# Patient Record
Sex: Female | Born: 1960 | Race: White | Hispanic: No | Marital: Married | Smoking: Never smoker
Health system: Southern US, Community
[De-identification: ages and names within clinical notes are randomized; demographics above are authoritative.]

---

## 1998-04-11 ENCOUNTER — Other Ambulatory Visit: Admission: RE | Admit: 1998-04-11 | Discharge: 1998-04-11 | Payer: Self-pay | Admitting: Obstetrics and Gynecology

## 1999-06-01 ENCOUNTER — Other Ambulatory Visit: Admission: RE | Admit: 1999-06-01 | Discharge: 1999-06-01 | Payer: Self-pay | Admitting: Obstetrics and Gynecology

## 1999-06-07 ENCOUNTER — Encounter: Payer: Self-pay | Admitting: Obstetrics and Gynecology

## 1999-06-07 ENCOUNTER — Encounter: Admission: RE | Admit: 1999-06-07 | Discharge: 1999-06-07 | Payer: Self-pay | Admitting: Obstetrics and Gynecology

## 2000-02-17 ENCOUNTER — Inpatient Hospital Stay (HOSPITAL_COMMUNITY): Admission: AD | Admit: 2000-02-17 | Discharge: 2000-02-17 | Payer: Self-pay | Admitting: Obstetrics and Gynecology

## 2000-02-19 ENCOUNTER — Inpatient Hospital Stay (HOSPITAL_COMMUNITY): Admission: AD | Admit: 2000-02-19 | Discharge: 2000-02-21 | Payer: Self-pay | Admitting: Obstetrics and Gynecology

## 2000-04-04 ENCOUNTER — Other Ambulatory Visit: Admission: RE | Admit: 2000-04-04 | Discharge: 2000-04-04 | Payer: Self-pay | Admitting: Obstetrics and Gynecology

## 2001-03-20 ENCOUNTER — Encounter: Payer: Self-pay | Admitting: Obstetrics and Gynecology

## 2001-03-20 ENCOUNTER — Encounter: Admission: RE | Admit: 2001-03-20 | Discharge: 2001-03-20 | Payer: Self-pay | Admitting: Obstetrics and Gynecology

## 2001-05-09 ENCOUNTER — Other Ambulatory Visit: Admission: RE | Admit: 2001-05-09 | Discharge: 2001-05-09 | Payer: Self-pay | Admitting: Obstetrics and Gynecology

## 2002-08-17 ENCOUNTER — Other Ambulatory Visit: Admission: RE | Admit: 2002-08-17 | Discharge: 2002-08-17 | Payer: Self-pay | Admitting: Obstetrics and Gynecology

## 2003-11-08 ENCOUNTER — Other Ambulatory Visit: Admission: RE | Admit: 2003-11-08 | Discharge: 2003-11-08 | Payer: Self-pay | Admitting: Obstetrics and Gynecology

## 2004-11-07 ENCOUNTER — Other Ambulatory Visit: Admission: RE | Admit: 2004-11-07 | Discharge: 2004-11-07 | Payer: Self-pay | Admitting: Obstetrics and Gynecology

## 2007-06-17 ENCOUNTER — Encounter: Admission: RE | Admit: 2007-06-17 | Discharge: 2007-06-17 | Payer: Self-pay | Admitting: Obstetrics and Gynecology

## 2010-02-22 ENCOUNTER — Encounter: Admission: RE | Admit: 2010-02-22 | Discharge: 2010-02-22 | Payer: Self-pay | Admitting: Obstetrics and Gynecology

## 2010-07-16 ENCOUNTER — Encounter: Payer: Self-pay | Admitting: Obstetrics and Gynecology

## 2010-11-10 NOTE — Op Note (Signed)
[facility]  Patient:    Crystal Wade, Crystal Wade                       MRN: [id number] Proc. Date: 02/19/00 Adm. Date:  [id number] Attending:  Cordelia Pen Ii                           Operative Report  PREOPERATIVE DIAGNOSIS:       Intrauterine pregnancy at term.  POSTOPERATIVE DIAGNOSIS:      Intrauterine pregnancy at term.  OPERATION:                    Vacuum assisted vaginal delivery.  SURGEON:                      Duke Salvia. Marcelle Overlie, M.D.  ASSISTANT:  ANESTHESIA:                   Epidural anesthesia.  ESTIMATED BLOOD LOSS:         350 cc.  DESCRIPTION OF PROCEDURE:     The patient had a rapid first stage labor. Epidural had been placed but she had an extreme amount of pain and was not able to push very well. The vertex was presenting visible without spreading the labia.  Decision was made to proceed with VE assisted delivery. Tender Touch VE applied.  One traction effort to effect delivery of a female.  There was a loose nuchal cord x 1. There were also two true knots in the cord side by side.  Apgars 9 and 9. The infant was suctioned. The cord was clamped.  The baby was placed in the warmer and doing well.  Placenta delivered spontaneously intact.  Estimated blood loss was 350.  The cervix and upper vagina explored and noted to be intact. There was a second degree laceration repaired in standard fashion with 3-0 Vicryl ________ sutures.  Mother and baby doing well at that point. DD:  02/19/00 TD:  02/20/00 Job: [id number]

## 2016-07-12 ENCOUNTER — Ambulatory Visit (INDEPENDENT_AMBULATORY_CARE_PROVIDER_SITE_OTHER): Payer: Self-pay | Admitting: Orthopedic Surgery

## 2016-07-14 ENCOUNTER — Ambulatory Visit (INDEPENDENT_AMBULATORY_CARE_PROVIDER_SITE_OTHER): Payer: Managed Care, Other (non HMO) | Admitting: Orthopedic Surgery

## 2016-07-14 ENCOUNTER — Ambulatory Visit (INDEPENDENT_AMBULATORY_CARE_PROVIDER_SITE_OTHER): Payer: Self-pay | Admitting: Orthopedic Surgery

## 2016-07-14 DIAGNOSIS — G8929 Other chronic pain: Secondary | ICD-10-CM

## 2016-07-14 DIAGNOSIS — M25511 Pain in right shoulder: Secondary | ICD-10-CM

## 2016-07-14 DIAGNOSIS — M24111 Other articular cartilage disorders, right shoulder: Secondary | ICD-10-CM | POA: Diagnosis not present

## 2016-07-14 NOTE — Progress Notes (Signed)
   Office Visit Note   Patient: Crystal Wade           Date of Birth: 12/19/1960           MRN: [id number] Visit Date: 07/14/2016              Requested by: No referring provider defined for this encounter. PCP: Meriel PicaHOLLAND,RICHARD M, MD  Chief Complaint  Patient presents with  . Right Shoulder - Pain    WUJ:[id number]:Patient is status post subacromial injection for his right shoulder. She states she really did not notice an improvement. She has pain with activities of daily living pain with trying to reach laterally and trying to lift light weight objects and difficulty trying to play tennis. HPI  Assessment & Plan: Visit Diagnoses:  1. Chronic right shoulder pain   2. Labral tear of shoulder, degenerative, right     Plan: With patient's labral pathology we will plan for an MRI scan to further evaluate the labrum. Follow-up after the MRIs obtained and at that time anticipate proceeding with arthroscopic intervention for labral pathology.  Follow-Up Instructions: Return if symptoms worsen or fail to improve.   Ortho Exam Examination patient is alert oriented no adenopathy well-dressed normal affect normal respiratory effort she has a normal gait. Examination she has full range of motion of both shoulders. She has mild pain with Neer and Hawkins impingement test. The biceps tendon is tender to palpation on the right. The before meals joint is nontender to palpation. She has maximal pain with the empty can sign.  Imaging: No results found.  Orders:  Orders Placed This Encounter  Procedures  . MR SHOULDER RIGHT WO CONTRAST   No orders of the defined types were placed in this encounter.    Procedures: No procedures performed  Clinical Data: No additional findings.  Subjective: Review of Systems  Objective: Vital Signs: There were no vitals taken for this visit.  Specialty Comments:  No specialty comments available.  PMFS History: Patient Active Problem List   Diagnosis Date  Noted  . Chronic right shoulder pain 07/14/2016  . Labral tear of shoulder, degenerative, right 07/14/2016   No past medical history on file.  No family history on file.  No past surgical history on file. Social History   Occupational History  . Not on file.   Social History Main Topics  . Smoking status: Not on file  . Smokeless tobacco: Not on file  . Alcohol use Not on file  . Drug use: Unknown  . Sexual activity: Not on file

## 2016-07-27 ENCOUNTER — Ambulatory Visit
Admission: RE | Admit: 2016-07-27 | Discharge: 2016-07-27 | Disposition: A | Discharge status: Home / Self Care | Payer: Managed Care, Other (non HMO) | Source: Ambulatory Visit | Attending: Orthopedic Surgery | Admitting: Orthopedic Surgery

## 2016-07-27 DIAGNOSIS — M24111 Other articular cartilage disorders, right shoulder: Secondary | ICD-10-CM

## 2016-08-02 ENCOUNTER — Ambulatory Visit (INDEPENDENT_AMBULATORY_CARE_PROVIDER_SITE_OTHER): Payer: Managed Care, Other (non HMO) | Admitting: Orthopedic Surgery

## 2016-08-02 ENCOUNTER — Encounter (INDEPENDENT_AMBULATORY_CARE_PROVIDER_SITE_OTHER): Payer: Self-pay | Admitting: Orthopedic Surgery

## 2016-08-02 DIAGNOSIS — G8929 Other chronic pain: Secondary | ICD-10-CM

## 2016-08-02 DIAGNOSIS — M25511 Pain in right shoulder: Secondary | ICD-10-CM | POA: Diagnosis not present

## 2016-08-02 MED ORDER — METHYLPREDNISOLONE ACETATE 40 MG/ML IJ SUSP
40.0000 mg | INTRAMUSCULAR | Status: AC | PRN
  Administered 2016-08-02: 40 mg via INTRA_ARTICULAR

## 2016-08-02 MED ORDER — LIDOCAINE HCL 1 % IJ SOLN
5.0000 mL | INTRAMUSCULAR | Status: AC | PRN
  Administered 2016-08-02: 5 mL

## 2016-08-02 NOTE — Progress Notes (Signed)
   Office Visit Note   Patient: Crystal Wade           Date of Birth: 04/26/1961           MRN: [id number] Visit Date: 08/02/2016              Requested by: Richarda Overlieichard Holland, MD [address] [place], [place] [id number] PCP: Meriel PicaHOLLAND,RICHARD M, MD  Chief Complaint  Patient presents with  . Right Shoulder - Follow-up    MRI review right shoulder    HPI: Patient is a 56 y.o female who presents in office today for review of MRI of right shoulder. Donalee CitrinStepheney L Peele, RT    Assessment & Plan: Visit Diagnoses:  1. Chronic right shoulder pain     Plan: Patient has had temporary relief from previous injection she underwent subacromial injection today. She is given instructions to go to the club to do internal/external rotation strengthening as well as scapular stabilization she will get a trainer for this. If she does not get sufficient relief with these exercises we will proceed with arthroscopic intervention. Her MRI scan was reviewed.  Follow-Up Instructions: Return if symptoms worsen or fail to improve.   Ortho Exam On examination patient is alert oriented no adenopathy well-dressed normal affect normal respiratory effort she has a normal gait. Examination she has pain with Neer and Hawkins impingement test before meals joint is nontender to palpation she has pain with trying to reach behind herself. MRI scan is reviewed which shows partial tearing of the rotator cuff without full-thickness retraction or full-thickness tear. Biceps tendon is intact she does have some glenohumeral synovitis clinically she has no adhesive capsulitis.  Imaging: No results found.  Orders:  No orders of the defined types were placed in this encounter.  No orders of the defined types were placed in this encounter.    Procedures: Large Joint Inj Date/Time: 08/02/2016 2:51 PM Performed by: Jamarious Febo V Authorized by: Nadara MustardUDA, Pinky Ravan V   Consent Given by:  Patient Site marked: the procedure  site was marked   Timeout: prior to procedure the correct patient, procedure, and site was verified   Indications:  Pain and diagnostic evaluation Location:  Shoulder Site:  R subacromial bursa Prep: patient was prepped and draped in usual sterile fashion   Needle Size:  22 G Needle Length:  1.5 inches Approach:  Posterior Ultrasound Guidance: No   Fluoroscopic Guidance: No   Arthrogram: No   Medications:  5 mL lidocaine 1 %; 40 mg methylPREDNISolone acetate 40 MG/ML Aspiration Attempted: No   Patient tolerance:  Patient tolerated the procedure well with no immediate complications    Clinical Data: No additional findings.  Subjective: Review of Systems  Objective: Vital Signs: There were no vitals taken for this visit.  Specialty Comments:  No specialty comments available.  PMFS History: Patient Active Problem List   Diagnosis Date Noted  . Chronic right shoulder pain 07/14/2016  . Labral tear of shoulder, degenerative, right 07/14/2016   History reviewed. No pertinent past medical history.  History reviewed. No pertinent family history.  History reviewed. No pertinent surgical history. Social History   Occupational History  . Not on file.   Social History Main Topics  . Smoking status: Never Smoker  . Smokeless tobacco: Never Used  . Alcohol use Not on file  . Drug use: Unknown  . Sexual activity: Not on file

## 2017-12-11 ENCOUNTER — Other Ambulatory Visit: Payer: Self-pay | Admitting: Obstetrics and Gynecology

## 2017-12-11 DIAGNOSIS — R928 Other abnormal and inconclusive findings on diagnostic imaging of breast: Secondary | ICD-10-CM

## 2017-12-13 ENCOUNTER — Ambulatory Visit
Admission: RE | Admit: 2017-12-13 | Discharge: 2017-12-13 | Disposition: A | Discharge status: Home / Self Care | Payer: BLUE CROSS/BLUE SHIELD | Source: Ambulatory Visit | Attending: Obstetrics and Gynecology | Admitting: Obstetrics and Gynecology

## 2017-12-13 ENCOUNTER — Other Ambulatory Visit: Payer: Self-pay | Admitting: Obstetrics and Gynecology

## 2017-12-13 ENCOUNTER — Ambulatory Visit
Admission: RE | Admit: 2017-12-13 | Discharge: 2017-12-13 | Disposition: A | Discharge status: Home / Self Care | Payer: Managed Care, Other (non HMO) | Source: Ambulatory Visit | Attending: Obstetrics and Gynecology | Admitting: Obstetrics and Gynecology

## 2017-12-13 DIAGNOSIS — N6310 Unspecified lump in the right breast, unspecified quadrant: Secondary | ICD-10-CM

## 2017-12-13 DIAGNOSIS — R928 Other abnormal and inconclusive findings on diagnostic imaging of breast: Secondary | ICD-10-CM

## 2018-06-30 ENCOUNTER — Other Ambulatory Visit: Payer: Self-pay | Admitting: Obstetrics and Gynecology

## 2018-06-30 ENCOUNTER — Ambulatory Visit
Admission: RE | Admit: 2018-06-30 | Discharge: 2018-06-30 | Disposition: A | Discharge status: Home / Self Care | Payer: BLUE CROSS/BLUE SHIELD | Source: Ambulatory Visit | Attending: Obstetrics and Gynecology | Admitting: Obstetrics and Gynecology

## 2018-06-30 DIAGNOSIS — N6310 Unspecified lump in the right breast, unspecified quadrant: Secondary | ICD-10-CM

## 2019-01-05 ENCOUNTER — Other Ambulatory Visit: Payer: BLUE CROSS/BLUE SHIELD

## 2019-01-14 ENCOUNTER — Other Ambulatory Visit: Payer: Self-pay | Admitting: Obstetrics and Gynecology

## 2019-01-14 DIAGNOSIS — Z20822 Contact with and (suspected) exposure to covid-19: Secondary | ICD-10-CM

## 2019-01-18 LAB — NOVEL CORONAVIRUS, NAA: SARS-CoV-2, NAA: NOT DETECTED

## 2019-02-02 ENCOUNTER — Ambulatory Visit
Admission: RE | Admit: 2019-02-02 | Discharge: 2019-02-02 | Disposition: A | Discharge status: Home / Self Care | Payer: BC Managed Care – PPO | Source: Ambulatory Visit | Attending: Obstetrics and Gynecology | Admitting: Obstetrics and Gynecology

## 2019-02-02 ENCOUNTER — Ambulatory Visit: Payer: BLUE CROSS/BLUE SHIELD

## 2019-02-02 ENCOUNTER — Ambulatory Visit
Admission: RE | Admit: 2019-02-02 | Discharge: 2019-02-02 | Disposition: A | Discharge status: Home / Self Care | Payer: BLUE CROSS/BLUE SHIELD | Source: Ambulatory Visit | Attending: Obstetrics and Gynecology | Admitting: Obstetrics and Gynecology

## 2019-02-02 ENCOUNTER — Other Ambulatory Visit: Payer: Self-pay

## 2019-02-02 ENCOUNTER — Other Ambulatory Visit: Payer: Self-pay | Admitting: Obstetrics and Gynecology

## 2019-02-02 DIAGNOSIS — N6310 Unspecified lump in the right breast, unspecified quadrant: Secondary | ICD-10-CM

## 2020-01-12 ENCOUNTER — Other Ambulatory Visit: Payer: Self-pay | Admitting: Obstetrics and Gynecology

## 2020-01-12 DIAGNOSIS — N6310 Unspecified lump in the right breast, unspecified quadrant: Secondary | ICD-10-CM

## 2020-02-03 ENCOUNTER — Other Ambulatory Visit: Payer: BC Managed Care – PPO

## 2020-03-03 ENCOUNTER — Other Ambulatory Visit: Payer: BC Managed Care – PPO

## 2020-03-09 ENCOUNTER — Other Ambulatory Visit: Payer: Self-pay

## 2020-03-09 ENCOUNTER — Ambulatory Visit
Admission: RE | Admit: 2020-03-09 | Discharge: 2020-03-09 | Disposition: A | Discharge status: Home / Self Care | Payer: BC Managed Care – PPO | Source: Ambulatory Visit | Attending: Obstetrics and Gynecology | Admitting: Obstetrics and Gynecology

## 2020-03-09 DIAGNOSIS — N6310 Unspecified lump in the right breast, unspecified quadrant: Secondary | ICD-10-CM

## 2020-05-30 ENCOUNTER — Ambulatory Visit: Payer: BC Managed Care – PPO | Attending: Internal Medicine

## 2020-05-30 DIAGNOSIS — Z23 Encounter for immunization: Secondary | ICD-10-CM

## 2020-05-30 NOTE — Progress Notes (Signed)
   Covid-19 Vaccination Clinic  Name:  Crystal Wade    MRN: [id number] DOB: [birth year]  05/30/2020  [name] was observed post Covid-19 immunization for 15 minutes without incident. She was provided with Vaccine Information Sheet and instruction to access the V-Safe system.   [name] was instructed to call 911 with any severe reactions post vaccine: Marland Kitchen Difficulty breathing  . Swelling of face and throat  . A fast heartbeat  . A bad rash all over body  . Dizziness and weakness   Immunizations Administered    Name Date Dose VIS Date Route   Pfizer COVID-19 Vaccine 05/30/2020  4:03 PM 0.3 mL 04/13/2020 Intramuscular   Manufacturer: [employer], [employer]   Lot: [id number]   NDC: [address]-4

## 2022-03-09 IMAGING — MG DIGITAL DIAGNOSTIC BILAT W/ TOMO W/ CAD
8 series · 9 of 24 positions shown · non-contrast
Comparison: Previous exam(s).

CLINICAL DATA: Follow-up of a probable benign right breast mass
originally seen in Thursday November, 2017.

EXAM:
DIGITAL DIAGNOSTIC BILATERAL MAMMOGRAM WITH CAD AND TOMO
ULTRASOUND LEFT BREAST

[R MLO synth-2D]
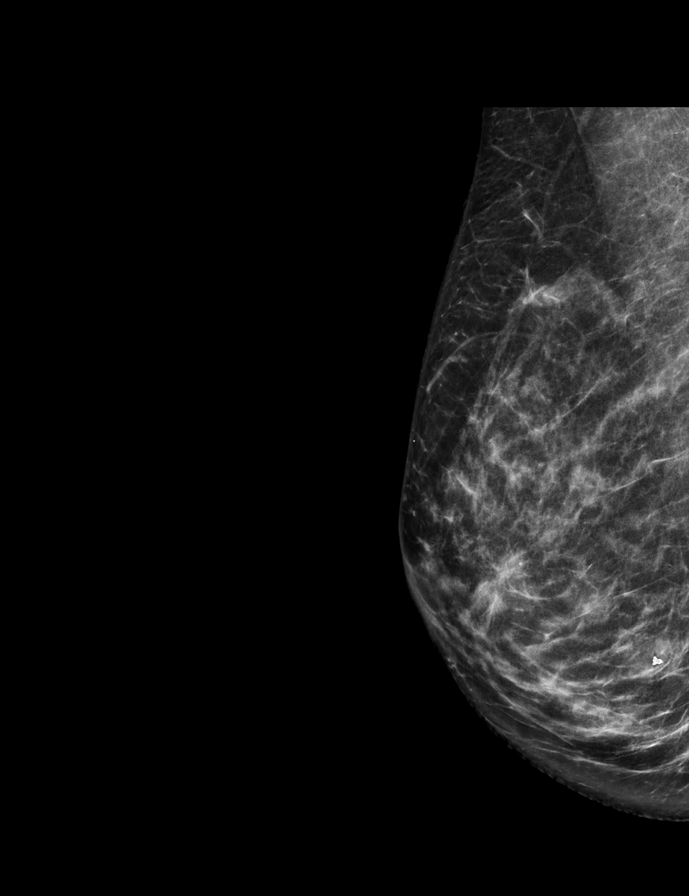

[R CC synth-2D]
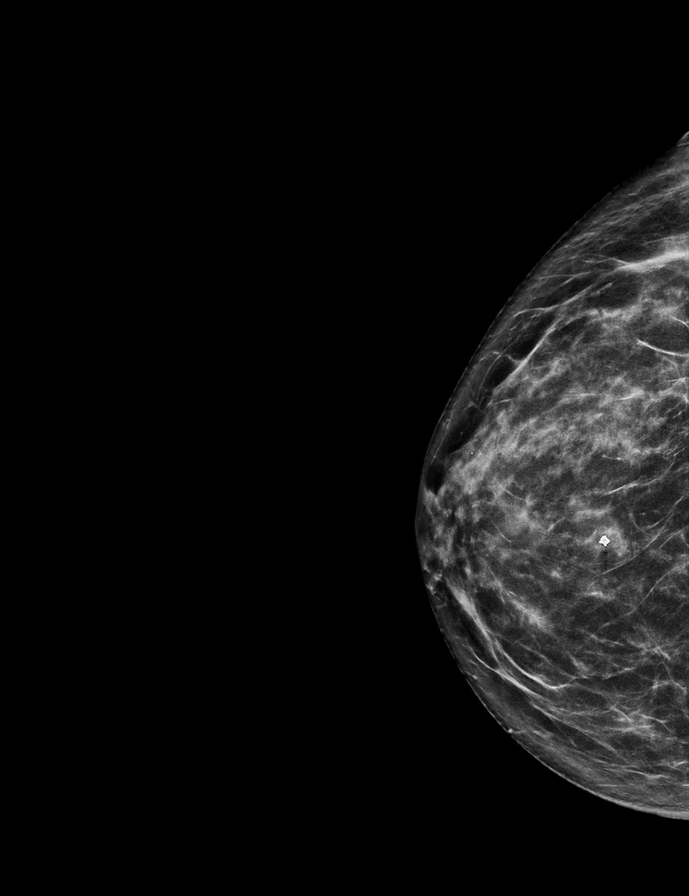

[L CC synth-2D]
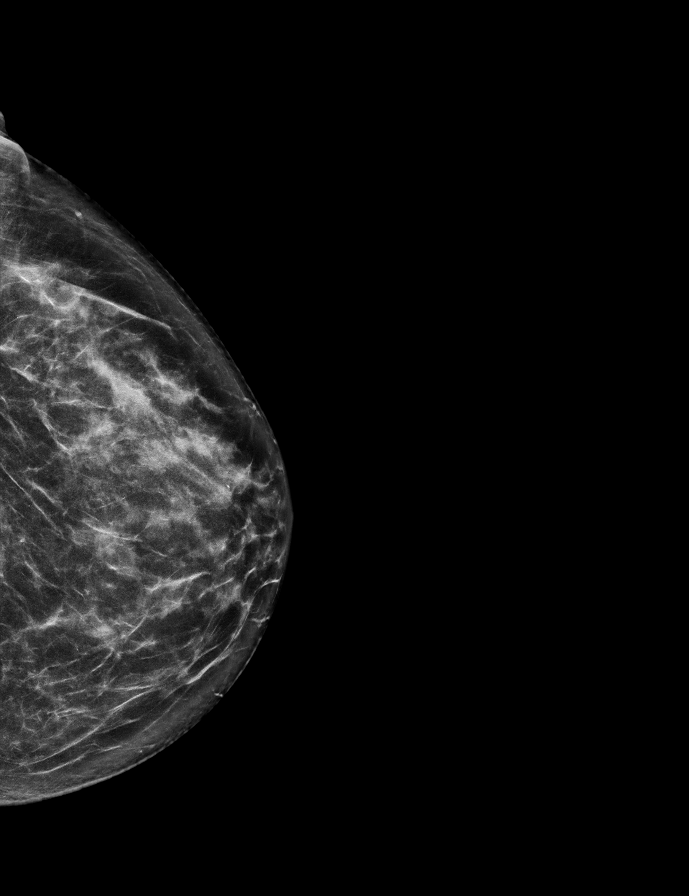

[L MLO synth-2D]
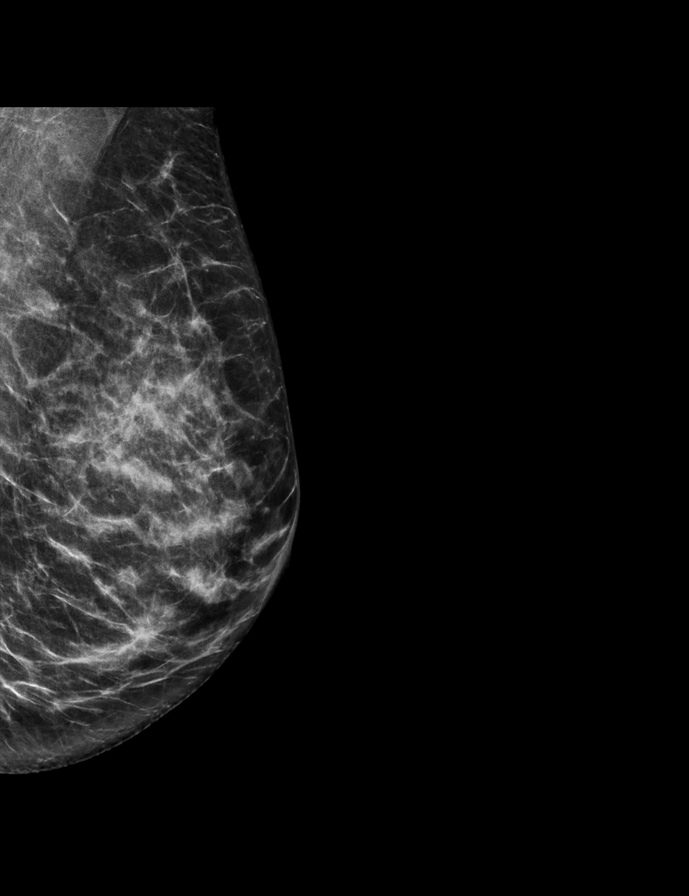

[L MLO tomo · 2 of 60 frames shown]
[frame 20/60]
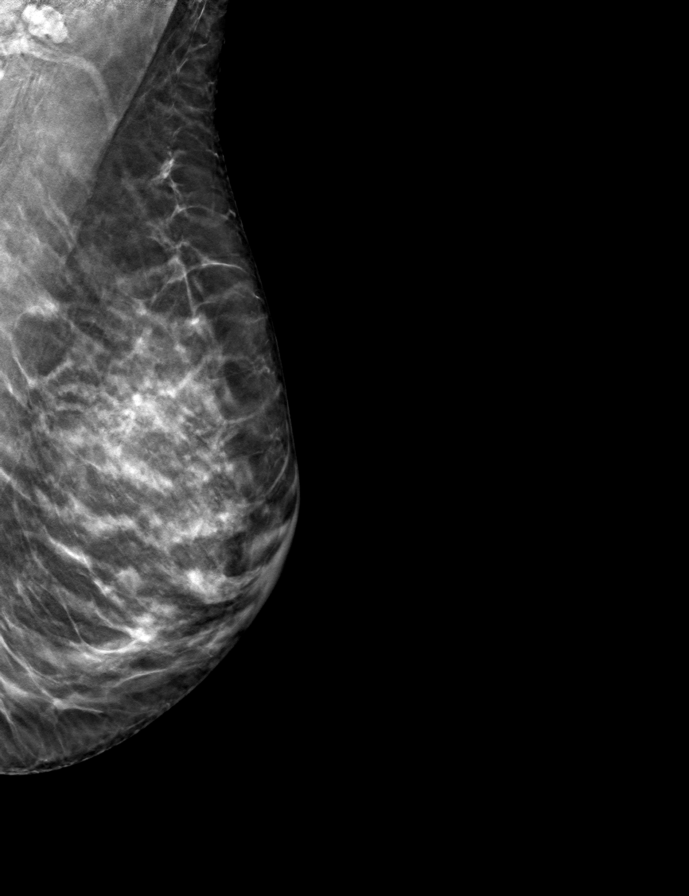
[frame 31/60]
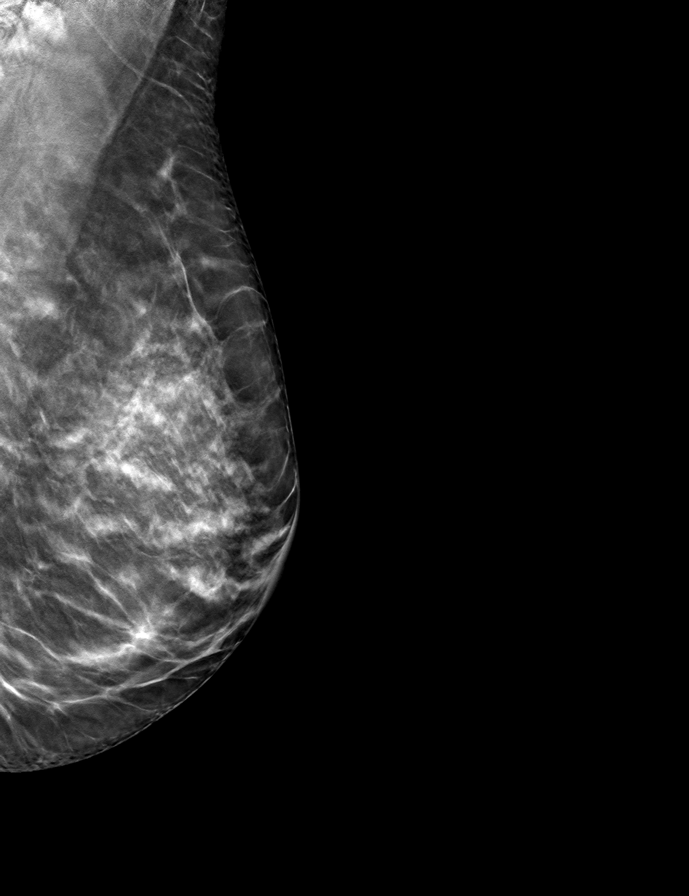

[R MLO tomo · tomo slice 31/62.0]
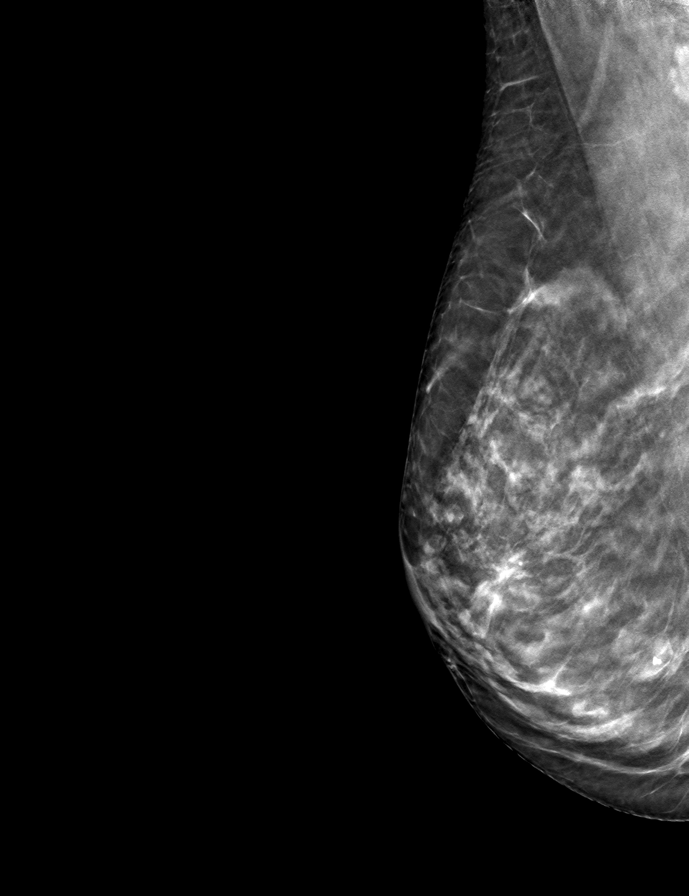

[R CC tomo · tomo slice 33/65.0]
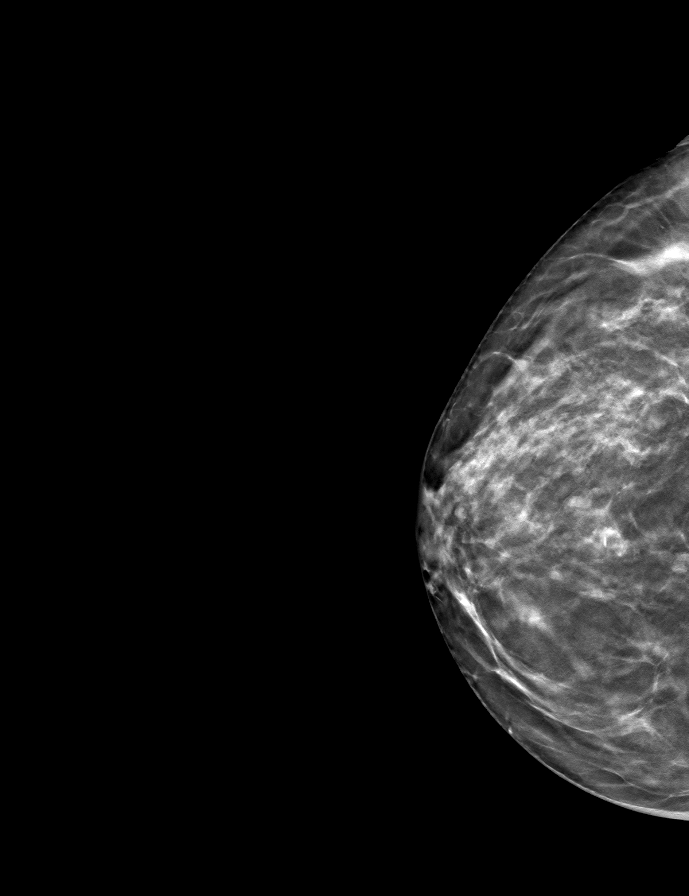

[L CC tomo · tomo slice 34/67.0]
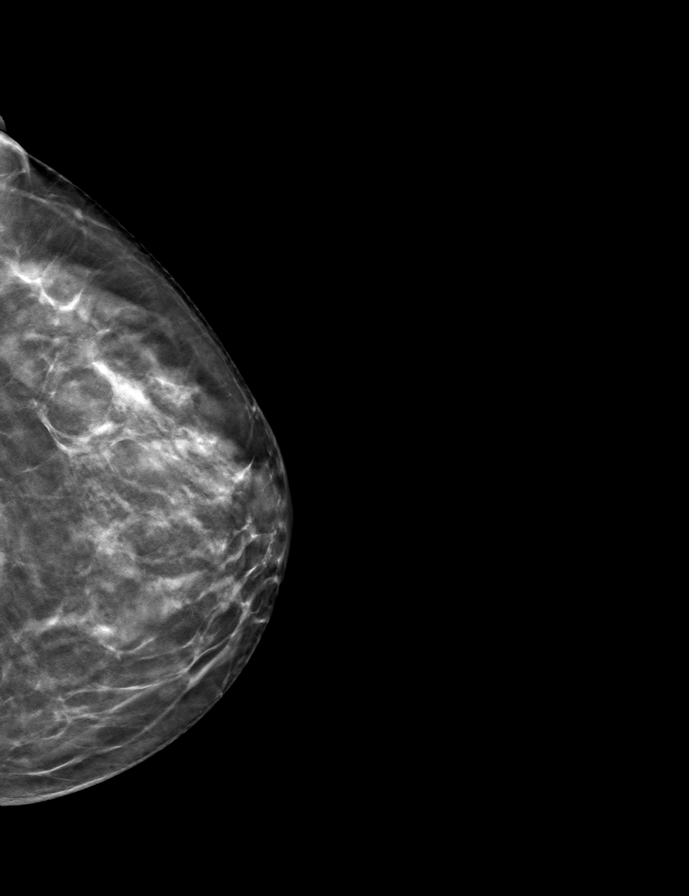

[9 of 24 positions shown; findings below may reference images not displayed]

ACR Breast Density Category c: The breast tissue is heterogeneously
dense, which may obscure small masses.
FINDINGS: No suspicious mass or malignant type microcalcifications identified
in the left breast.

There is a stable obscured mass with a coarse calcification in the
inferior aspect of the right breast. No suspicious mass or malignant
type microcalcifications identified in the right breast.

Mammographic images were processed with CAD.

Targeted ultrasound is performed, showing there is a stable
hypoechoic mass in the right breast at [DATE] 3 cm from the nipple
measuring 1.7 x 0.6 x 1.2 cm. On the prior ultrasound dated
12/13/2017 it measured 1.6 x 0.6 x 1.2 cm.
IMPRESSION: Stable benign-appearing mass in the right breast. No evidence of
malignancy in either breast.

RECOMMENDATION:
Bilateral screening mammogram in 1 year is recommended.

I have discussed the findings and recommendations with the patient.
If applicable, a reminder letter will be sent to the patient
regarding the next appointment.

BI-RADS CATEGORY  2: Benign.

## 2022-03-09 IMAGING — US US BREAST*R* LIMITED INC AXILLA
1 series · 5 of 5 positions shown · non-contrast
Comparison: Previous exam(s).

CLINICAL DATA: Follow-up of a probable benign right breast mass
originally seen in Thursday November, 2017.

EXAM:
DIGITAL DIAGNOSTIC BILATERAL MAMMOGRAM WITH CAD AND TOMO
ULTRASOUND LEFT BREAST

[Series 1: us breast*right* limited inc axilla · 0.06mm/px · 5 of 5 slices shown]
[im 1/5]
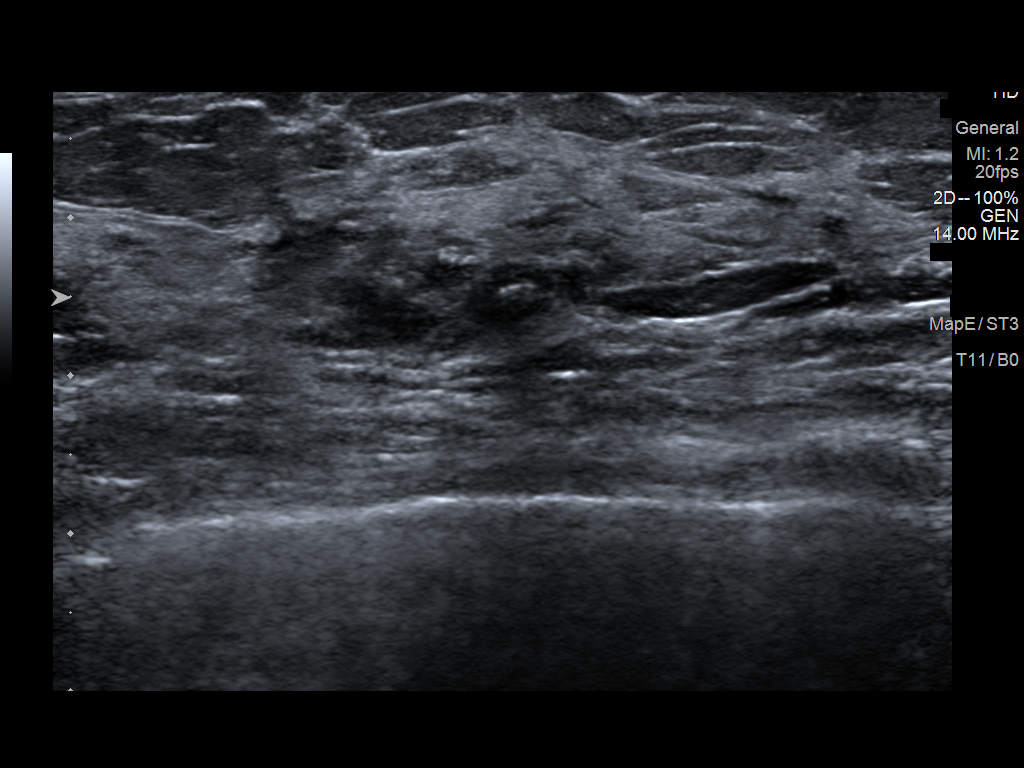
[im 2/5]
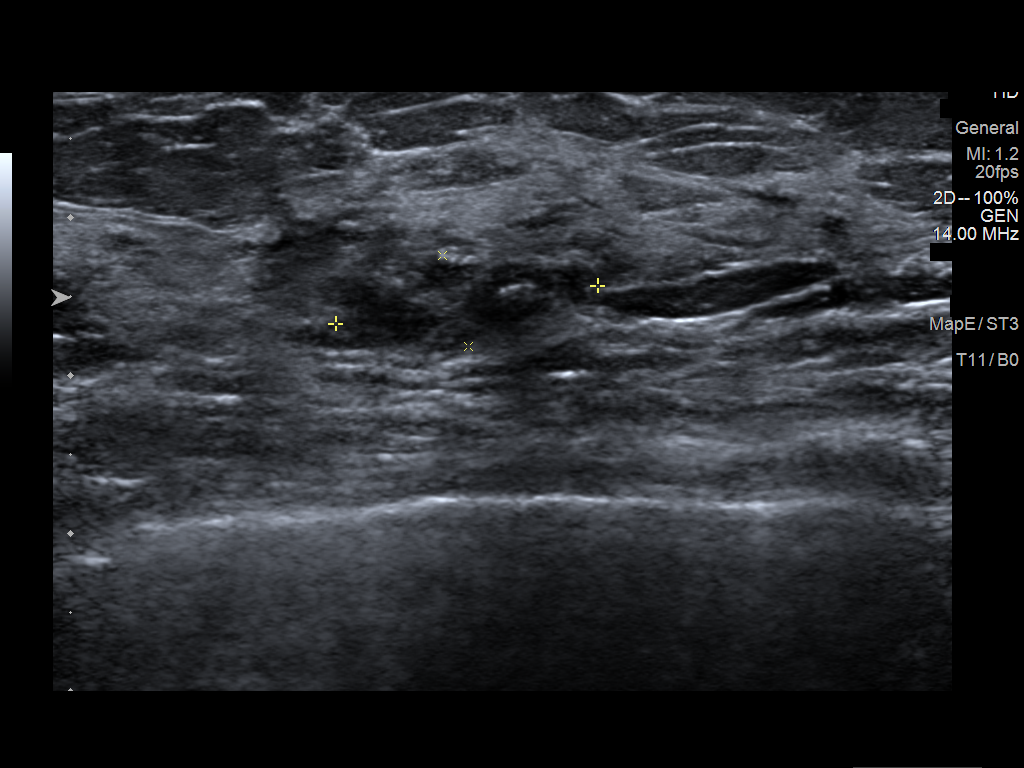
[im 3/5]
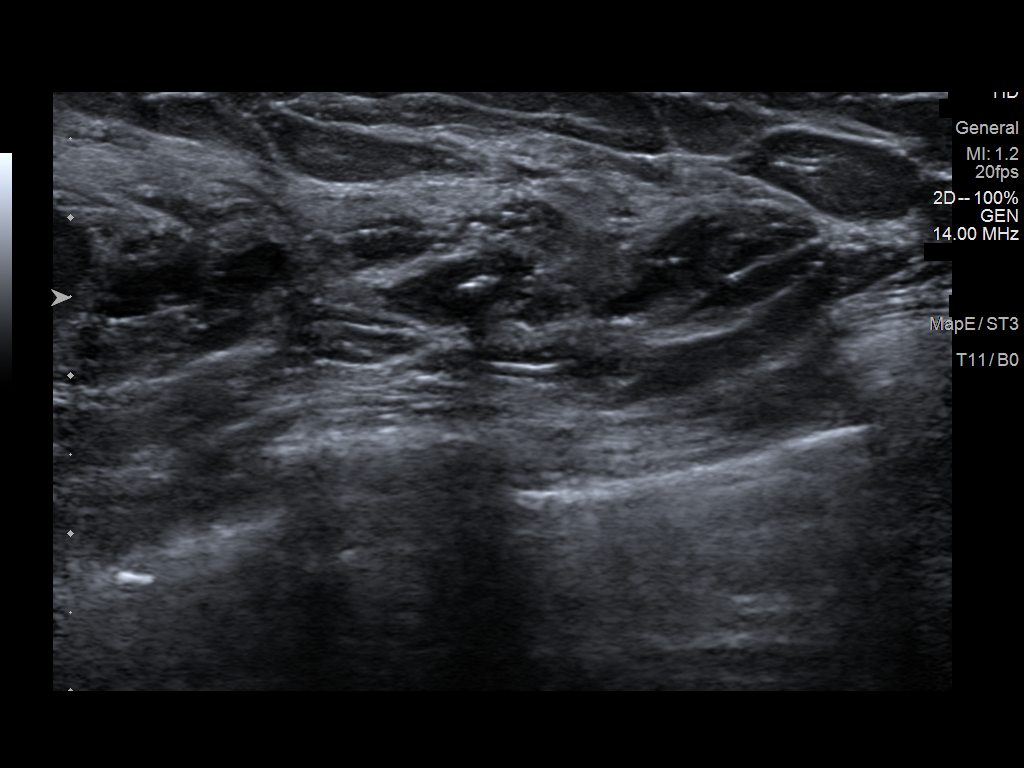
[im 4/5]
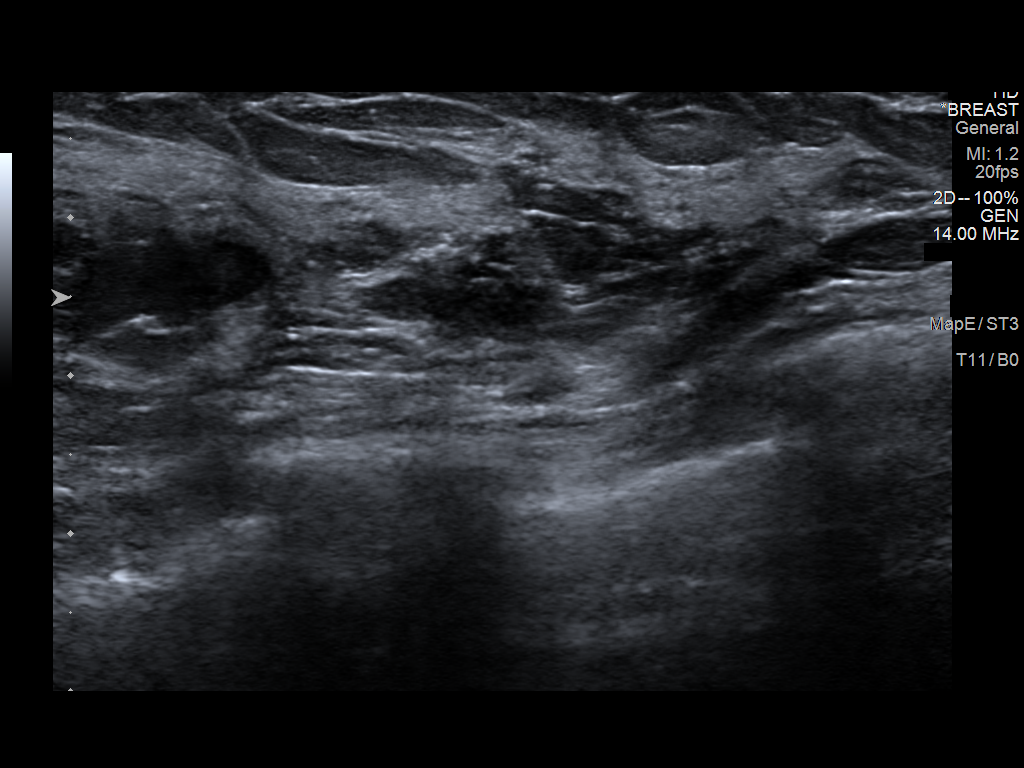
[im 5/5]
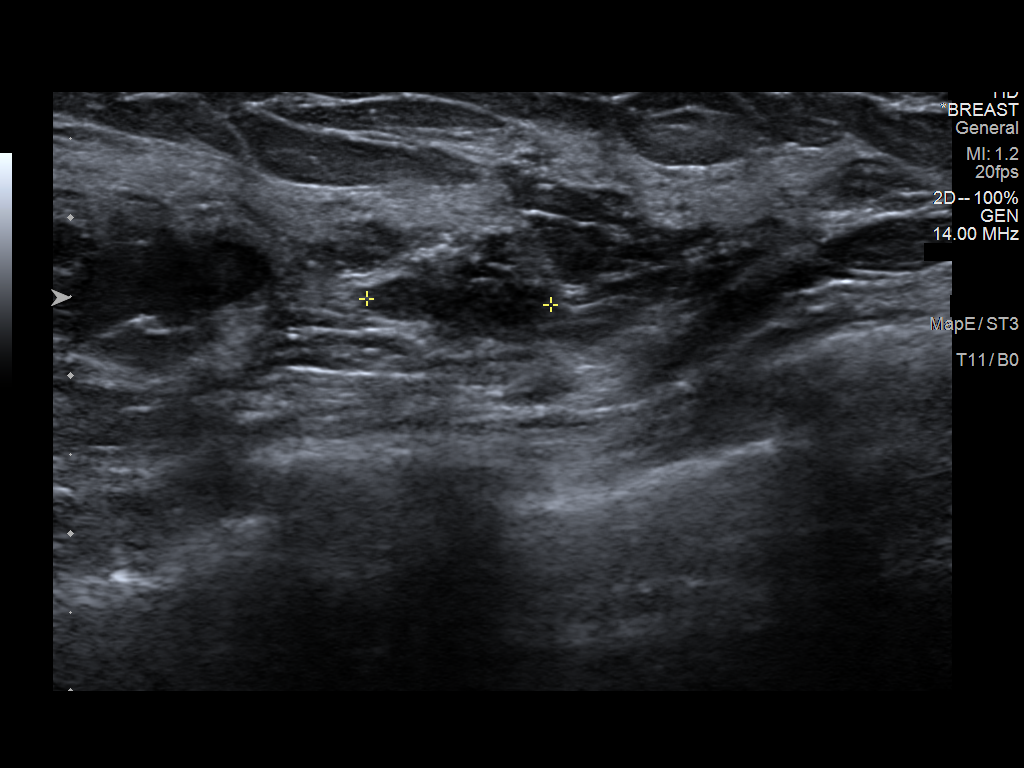

[5 of 5 positions shown; findings below may reference images not displayed]

ACR Breast Density Category c: The breast tissue is heterogeneously
dense, which may obscure small masses.
FINDINGS: No suspicious mass or malignant type microcalcifications identified
in the left breast.

There is a stable obscured mass with a coarse calcification in the
inferior aspect of the right breast. No suspicious mass or malignant
type microcalcifications identified in the right breast.

Mammographic images were processed with CAD.

Targeted ultrasound is performed, showing there is a stable
hypoechoic mass in the right breast at [DATE] 3 cm from the nipple
measuring 1.7 x 0.6 x 1.2 cm. On the prior ultrasound dated
12/13/2017 it measured 1.6 x 0.6 x 1.2 cm.
IMPRESSION: Stable benign-appearing mass in the right breast. No evidence of
malignancy in either breast.

RECOMMENDATION:
Bilateral screening mammogram in 1 year is recommended.

I have discussed the findings and recommendations with the patient.
If applicable, a reminder letter will be sent to the patient
regarding the next appointment.

BI-RADS CATEGORY  2: Benign.
